# Patient Record
Sex: Female | Born: 1960 | Race: White | Hispanic: No | Marital: Married | Smoking: Never smoker
Health system: Southern US, Community
[De-identification: ages and names within clinical notes are randomized; demographics above are authoritative.]

## PROBLEM LIST (undated history)

## (undated) DIAGNOSIS — G43909 Migraine, unspecified, not intractable, without status migrainosus: Secondary | ICD-10-CM

## (undated) DIAGNOSIS — M1990 Unspecified osteoarthritis, unspecified site: Secondary | ICD-10-CM

## (undated) HISTORY — PX: ECTOPIC PREGNANCY SURGERY: SHX613

## (undated) HISTORY — PX: TONSILLECTOMY: SUR1361

## (undated) HISTORY — PX: TUBAL LIGATION: SHX77

## (undated) HISTORY — DX: Unspecified osteoarthritis, unspecified site: M19.90

## (undated) HISTORY — PX: ADENOIDECTOMY: SUR15

## (undated) HISTORY — PX: TYMPANOPLASTY: SHX33

## (undated) HISTORY — DX: Migraine, unspecified, not intractable, without status migrainosus: G43.909

---

## 1998-05-12 ENCOUNTER — Other Ambulatory Visit: Admission: RE | Admit: 1998-05-12 | Discharge: 1998-05-12 | Payer: Self-pay | Admitting: Obstetrics & Gynecology

## 2000-06-07 ENCOUNTER — Other Ambulatory Visit: Admission: RE | Admit: 2000-06-07 | Discharge: 2000-06-07 | Payer: Self-pay | Admitting: Obstetrics & Gynecology

## 2002-01-07 ENCOUNTER — Other Ambulatory Visit: Admission: RE | Admit: 2002-01-07 | Discharge: 2002-01-07 | Payer: Self-pay | Admitting: Obstetrics & Gynecology

## 2006-06-08 ENCOUNTER — Ambulatory Visit (HOSPITAL_COMMUNITY): Admission: RE | Admit: 2006-06-08 | Discharge: 2006-06-08 | Payer: Self-pay | Admitting: Obstetrics & Gynecology

## 2011-11-02 ENCOUNTER — Other Ambulatory Visit: Payer: Self-pay | Admitting: Obstetrics & Gynecology

## 2014-07-06 ENCOUNTER — Other Ambulatory Visit: Payer: Self-pay | Admitting: Obstetrics & Gynecology

## 2014-07-07 LAB — CYTOLOGY - PAP

## 2016-11-13 ENCOUNTER — Other Ambulatory Visit: Payer: Self-pay | Admitting: Obstetrics & Gynecology

## 2016-11-14 LAB — CYTOLOGY - PAP

## 2018-02-20 ENCOUNTER — Other Ambulatory Visit: Payer: Self-pay | Admitting: Obstetrics & Gynecology

## 2018-02-20 DIAGNOSIS — R928 Other abnormal and inconclusive findings on diagnostic imaging of breast: Secondary | ICD-10-CM

## 2018-02-22 ENCOUNTER — Ambulatory Visit
Admission: RE | Admit: 2018-02-22 | Discharge: 2018-02-22 | Disposition: A | Discharge status: Home / Self Care | Payer: BC Managed Care – PPO | Source: Ambulatory Visit | Attending: Obstetrics & Gynecology | Admitting: Obstetrics & Gynecology

## 2018-02-22 DIAGNOSIS — R928 Other abnormal and inconclusive findings on diagnostic imaging of breast: Secondary | ICD-10-CM

## 2018-10-28 IMAGING — MG DIGITAL DIAGNOSTIC UNILATERAL LEFT MAMMOGRAM WITH TOMO AND CAD
8 series · 8 of 24 positions shown · non-contrast
Comparison: February 19, 2018

CLINICAL DATA: Possible masses identified in the left breast recent
screening mammogram [DATE].

EXAM:
DIGITAL DIAGNOSTIC LEFT MAMMOGRAM WITH CAD AND TOMO
ULTRASOUND LEFT BREAST

[L ML synth-2D]
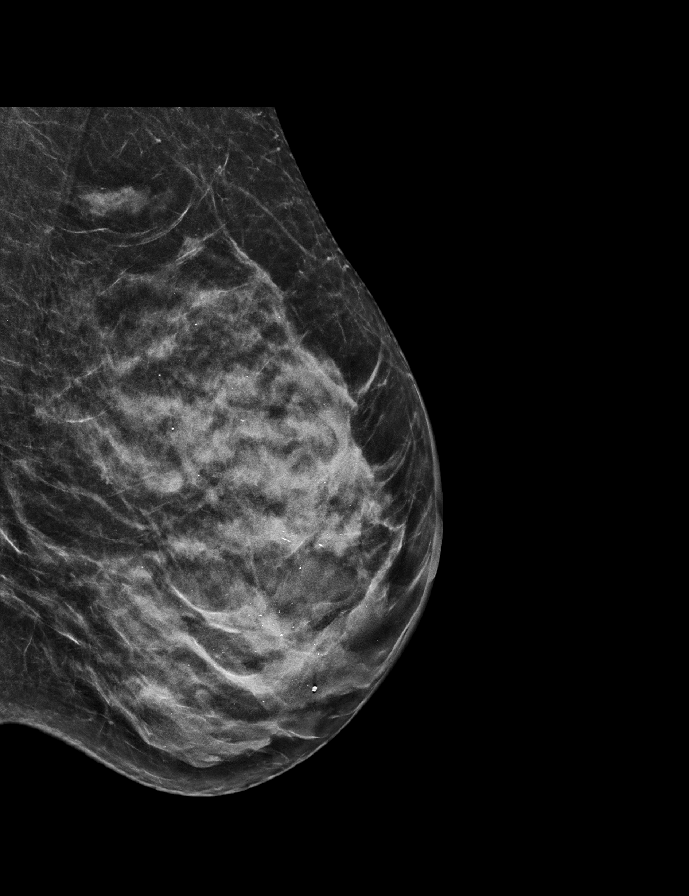

[L CC synth-2D (1 of 2)]
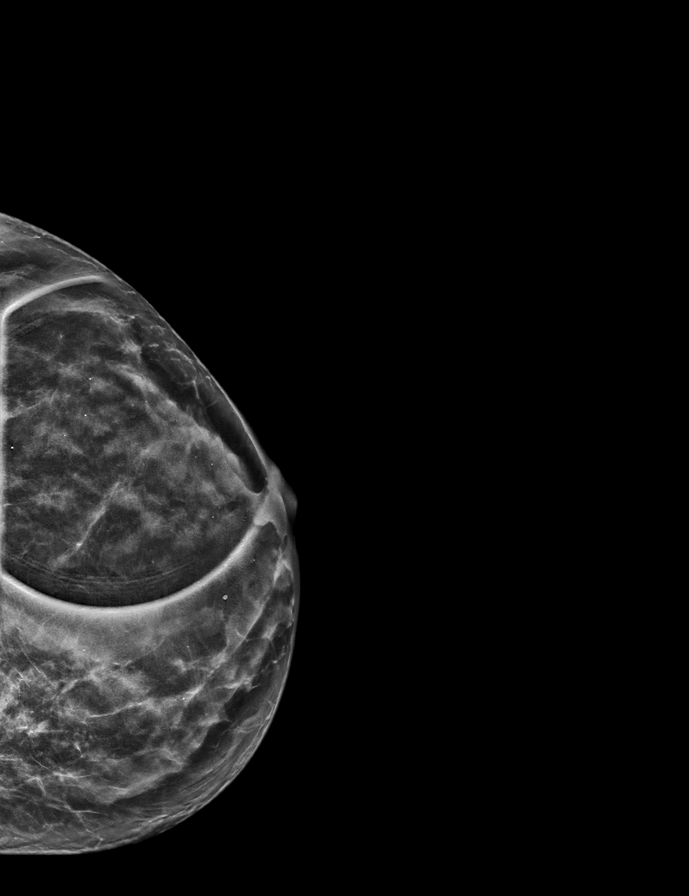

[L CC synth-2D (2 of 2)]
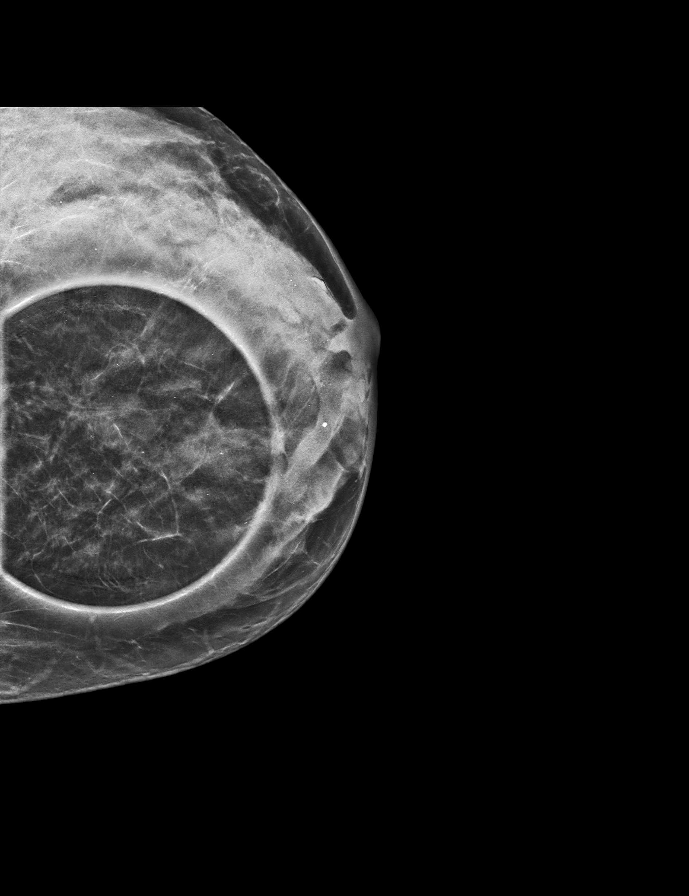

[L MLO synth-2D]
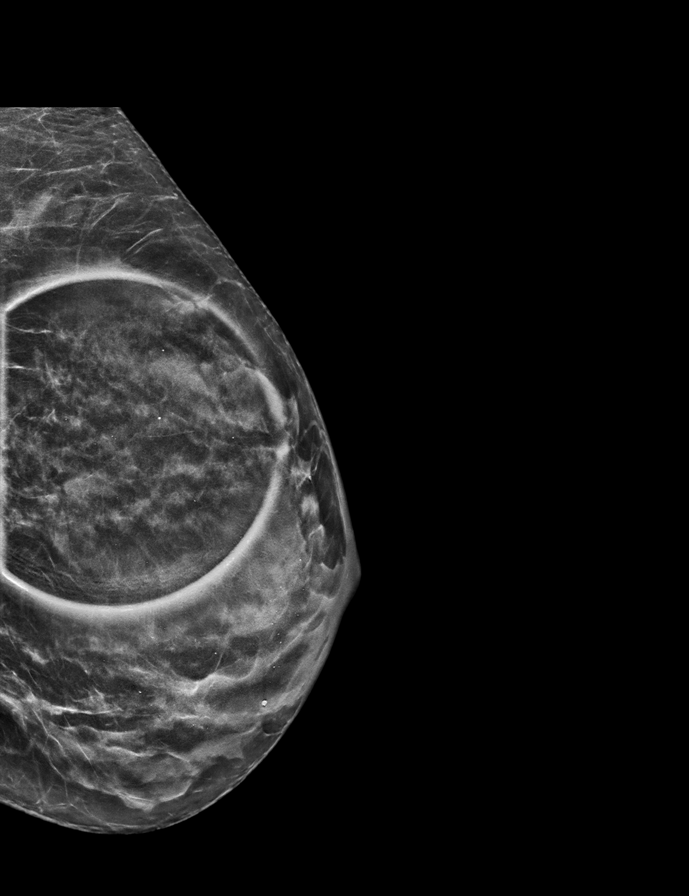

[L CC tomo (1 of 2) · tomo slice 23/44.0]
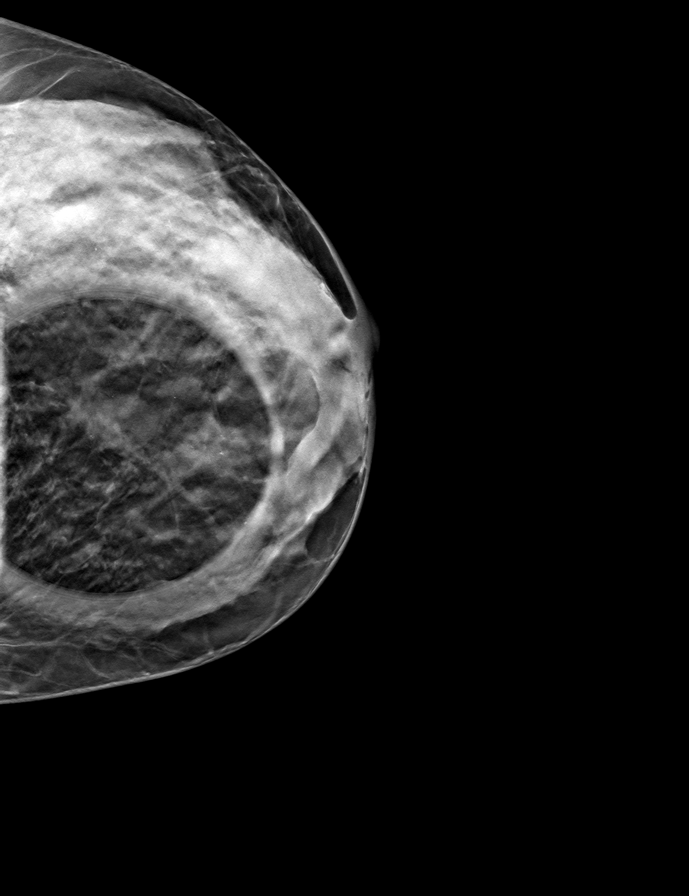

[L CC tomo (2 of 2) · tomo slice 26/51.0]
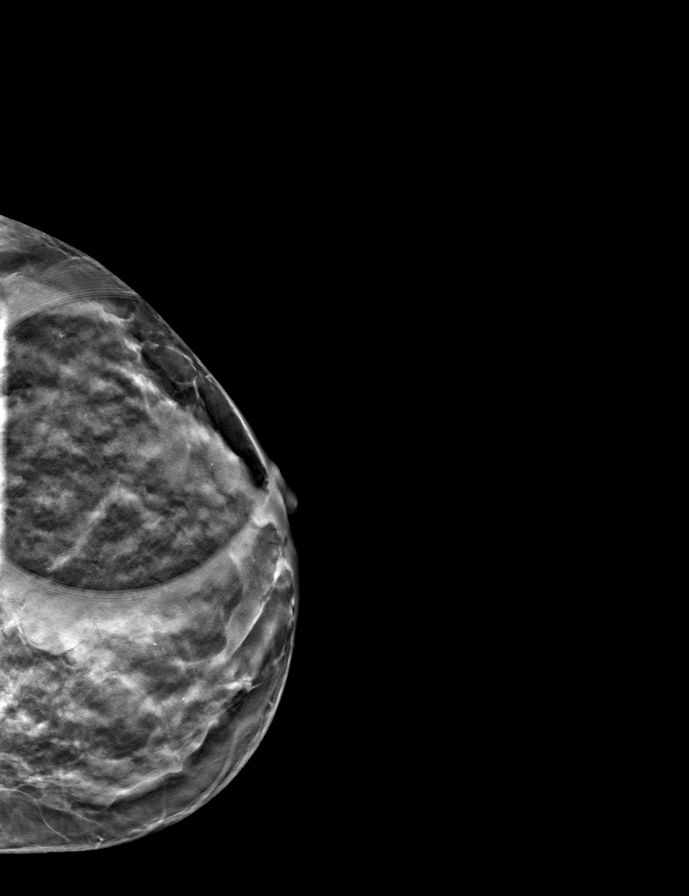

[L ML tomo · tomo slice 27/53.0]
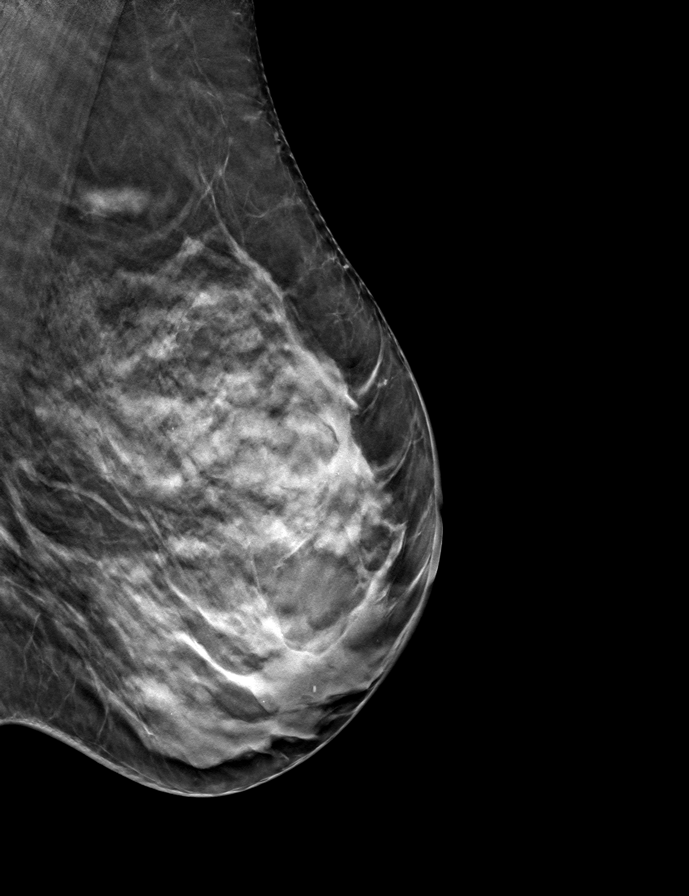

[L MLO tomo · tomo slice 27/53.0]
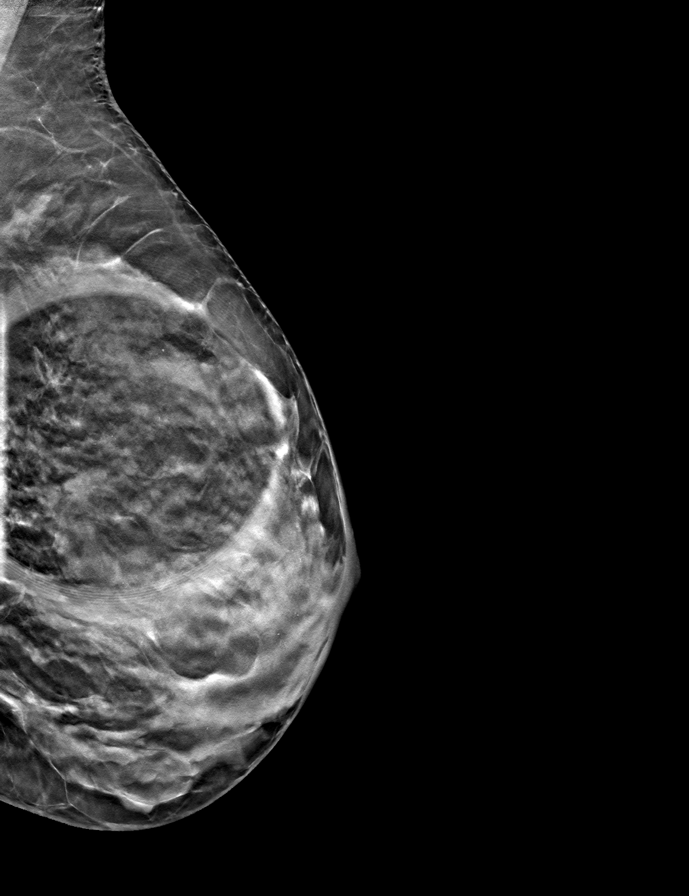

[8 of 24 positions shown; findings below may reference images not displayed]

ACR Breast Density Category c: The breast tissue is heterogeneously
dense, which may obscure small masses.
FINDINGS: Spot compression views of left breast confirm an oval circumscribed
mass the upper inner quadrant measuring approximately 1.7 cm.
Additionally, just lateral to the nipple is either a circumscribed
mass or duct ectasia.

Mammographic images were processed with CAD.

Targeted ultrasound is performed, showing a 1.7 x 0 7 x 1.7 cm
simple cyst at [DATE] position 2 cm from. This corresponds with the
location of the mammographically detected mass. In the periareolar
left breast there is mild benign duct ectasia and a 0.5 cm simple
cyst. These findings account for the area in question on recent
screening mammogram in the periareolar left breast. No suspicious
masses identified in the left breast.
IMPRESSION: Benign cysts and benign duct ectasia in the left breast. No evidence
of malignancy.

RECOMMENDATION:
Screening mammogram in one year.(Code:FG-V-ADF)

I have discussed the findings and recommendations with the patient.
Results were also provided in writing at the conclusion of the
visit. If applicable, a reminder letter will be sent to the patient
regarding the next appointment.

BI-RADS CATEGORY  2: Benign.

## 2018-10-28 IMAGING — US ULTRASOUND LEFT BREAST LIMITED
1 series · 10 of 10 positions shown · non-contrast
Comparison: February 19, 2018

CLINICAL DATA: Possible masses identified in the left breast recent
screening mammogram [DATE].

EXAM:
DIGITAL DIAGNOSTIC LEFT MAMMOGRAM WITH CAD AND TOMO
ULTRASOUND LEFT BREAST

[Series 1: ultrasound left breast limited · 0.06mm/px · 10 of 10 slices shown]
[im 1/10]
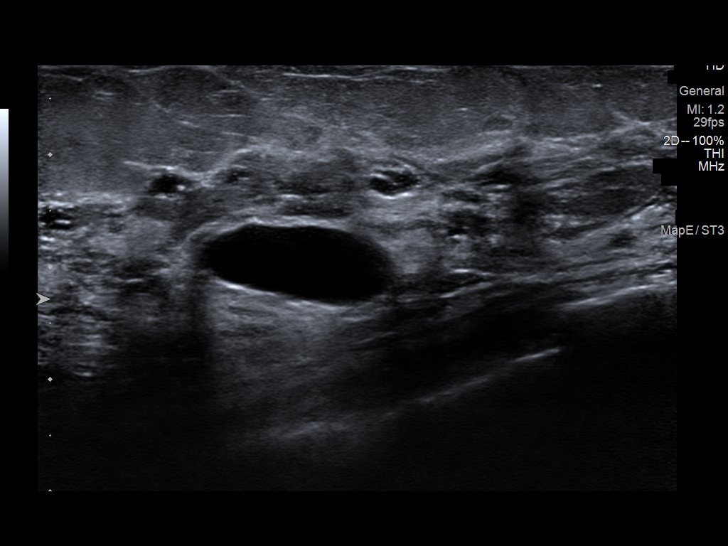
[im 2/10]
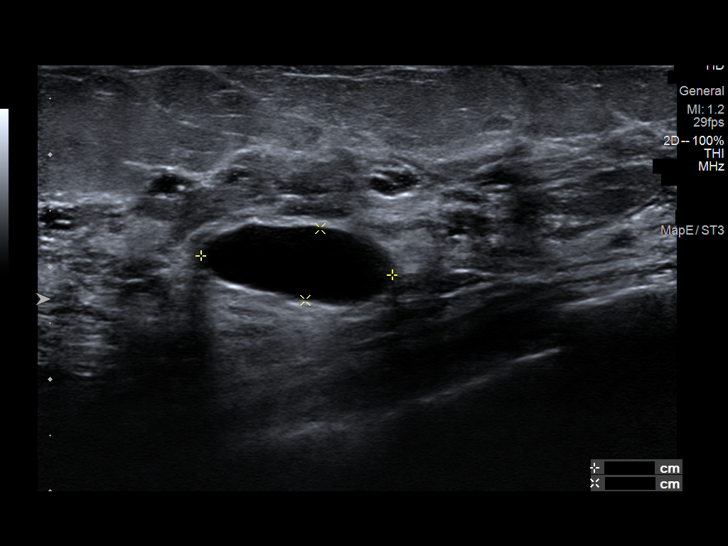
[im 3/10]
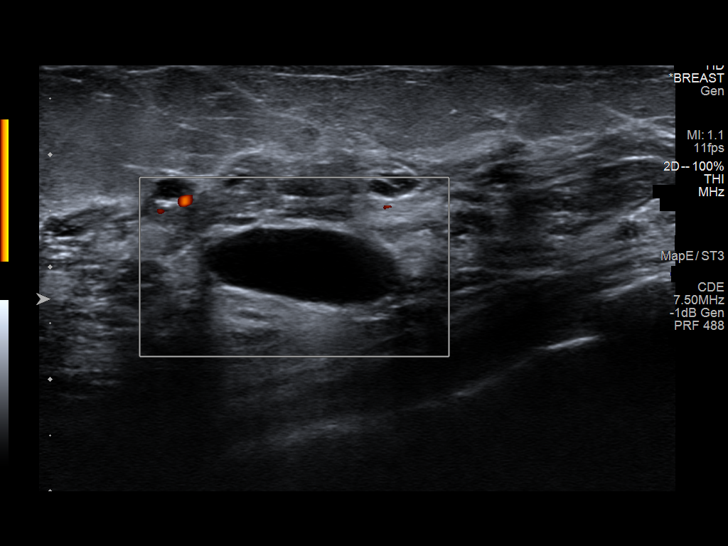
[im 4/10]
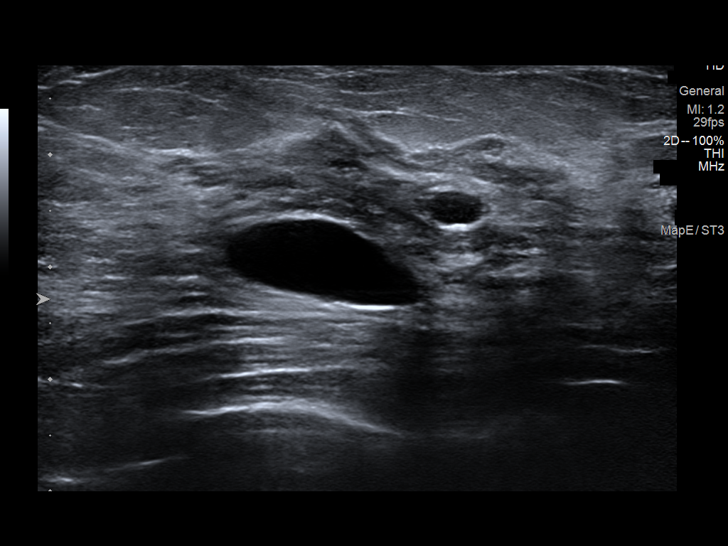
[im 5/10]
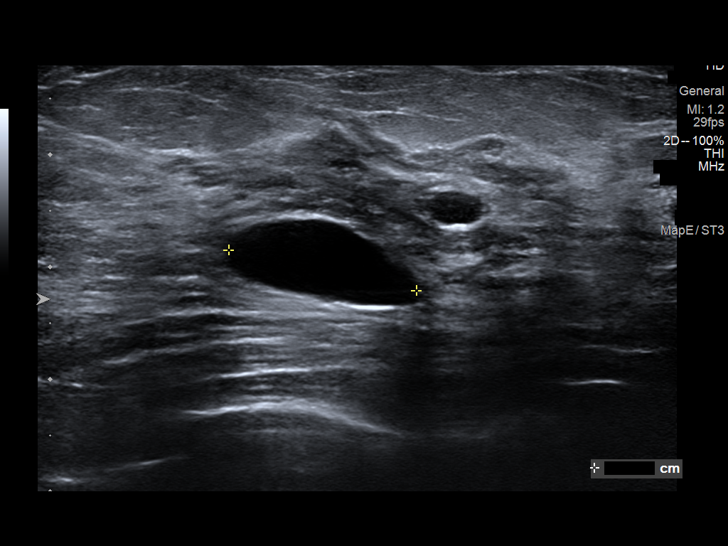
[im 6/10]
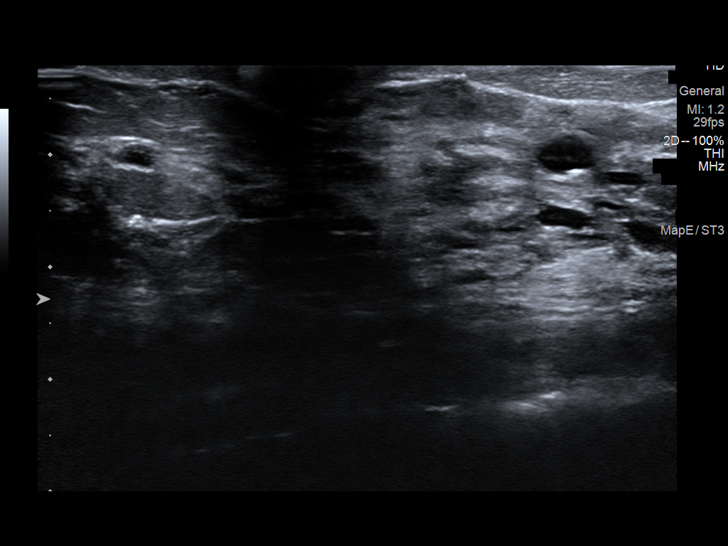
[im 7/10]
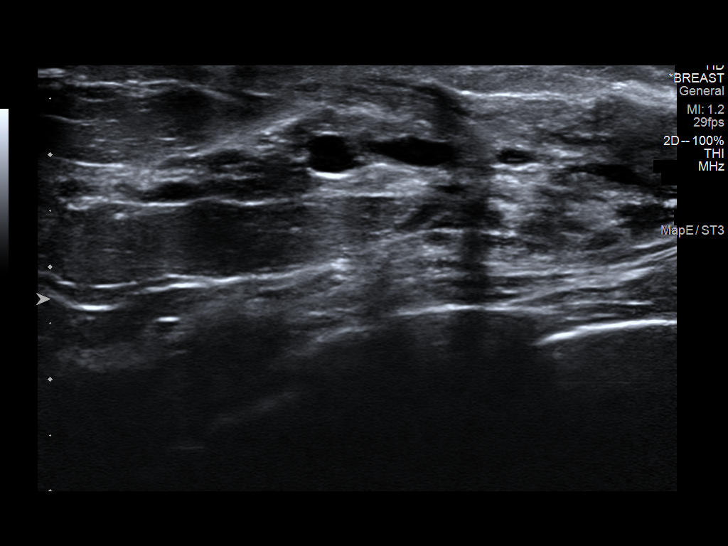
[im 8/10]
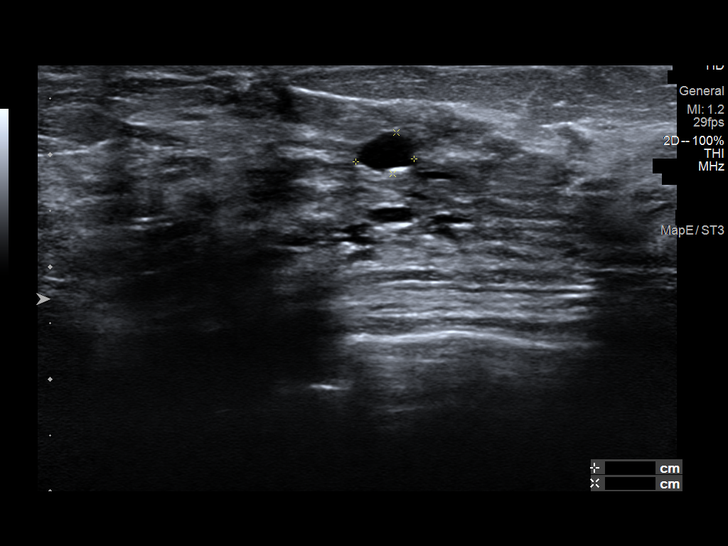
[im 9/10]
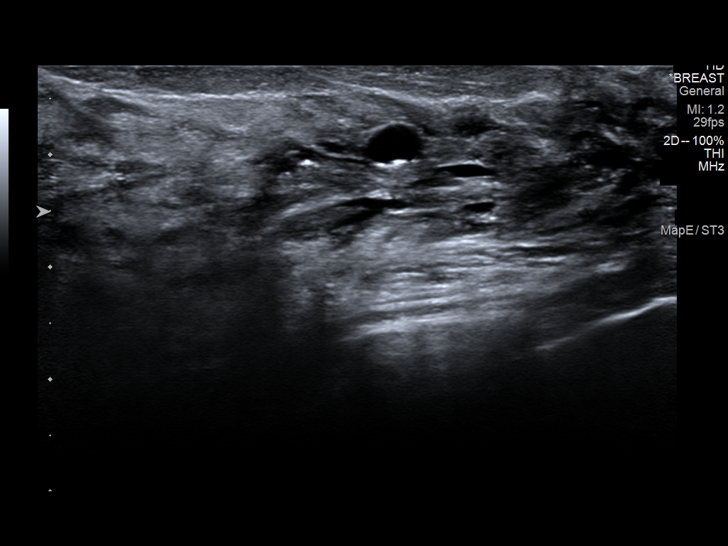
[im 10/10]
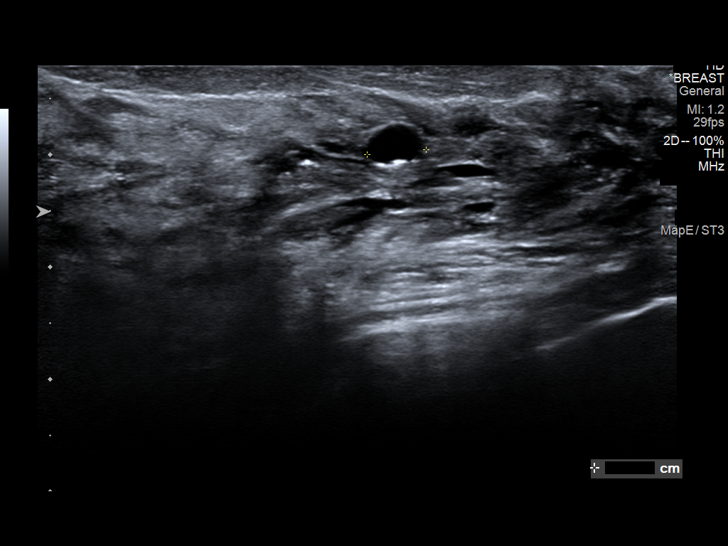

[10 of 10 positions shown; findings below may reference images not displayed]

ACR Breast Density Category c: The breast tissue is heterogeneously
dense, which may obscure small masses.
FINDINGS: Spot compression views of left breast confirm an oval circumscribed
mass the upper inner quadrant measuring approximately 1.7 cm.
Additionally, just lateral to the nipple is either a circumscribed
mass or duct ectasia.

Mammographic images were processed with CAD.

Targeted ultrasound is performed, showing a 1.7 x 0 7 x 1.7 cm
simple cyst at [DATE] position 2 cm from. This corresponds with the
location of the mammographically detected mass. In the periareolar
left breast there is mild benign duct ectasia and a 0.5 cm simple
cyst. These findings account for the area in question on recent
screening mammogram in the periareolar left breast. No suspicious
masses identified in the left breast.
IMPRESSION: Benign cysts and benign duct ectasia in the left breast. No evidence
of malignancy.

RECOMMENDATION:
Screening mammogram in one year.(Code:FG-V-ADF)

I have discussed the findings and recommendations with the patient.
Results were also provided in writing at the conclusion of the
visit. If applicable, a reminder letter will be sent to the patient
regarding the next appointment.

BI-RADS CATEGORY  2: Benign.

## 2022-11-27 ENCOUNTER — Ambulatory Visit: Payer: BC Managed Care – PPO | Admitting: Internal Medicine

## 2022-11-27 ENCOUNTER — Encounter: Payer: Self-pay | Admitting: Internal Medicine

## 2022-11-27 VITALS — BP 138/80 | HR 72 | Temp 97.4°F | Resp 16 | Ht 65.0 in | Wt 162.4 lb

## 2022-11-27 DIAGNOSIS — G43109 Migraine with aura, not intractable, without status migrainosus: Secondary | ICD-10-CM | POA: Diagnosis not present

## 2022-11-27 DIAGNOSIS — M1990 Unspecified osteoarthritis, unspecified site: Secondary | ICD-10-CM | POA: Diagnosis not present

## 2022-11-27 DIAGNOSIS — F4323 Adjustment disorder with mixed anxiety and depressed mood: Secondary | ICD-10-CM | POA: Insufficient documentation

## 2022-11-27 DIAGNOSIS — Z0000 Encounter for general adult medical examination without abnormal findings: Secondary | ICD-10-CM | POA: Insufficient documentation

## 2022-11-27 DIAGNOSIS — Z6827 Body mass index (BMI) 27.0-27.9, adult: Secondary | ICD-10-CM | POA: Diagnosis not present

## 2022-11-27 MED ORDER — ESCITALOPRAM OXALATE 10 MG PO TABS: 10.0000 mg | ORAL_TABLET | Freq: Every day | ORAL | 1 refills | Status: DC

## 2022-11-27 NOTE — Assessment & Plan Note (Signed)
She has probable OA.  Dr. Stann Mainland wrote her for meloxicam '15mg'$  po daily prn.  She can add tylenol prn to this.  I am going to check a RF.

## 2022-11-27 NOTE — Assessment & Plan Note (Signed)
I want her to eat healthy, exercise and lose weight. 

## 2022-11-27 NOTE — Assessment & Plan Note (Signed)
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  She score perfect on the MMSE today.  We did a PHQ2 which was 0 but I asked nursing to do a full PHQ9 where she scored a 12.  She states she does not feel depressed but she is having insomnia and problems with concentration.  I think she has stressors that is causing this.  I want her to try melatonin again for sleep and we will start her on a trial of lexapro '10mg'$  daily.  I will see her back in 2 months.

## 2022-11-27 NOTE — Assessment & Plan Note (Signed)
She does not have many migraines during the year but she is able to control this with OTC meds.

## 2022-11-27 NOTE — Progress Notes (Signed)
Office Visit  Subjective   Patient ID: Susan Friedman   DOB: 02-26-1961   Age: 62 y.o.   MRN: [id number]   Chief Complaint Chief Complaint  Patient presents with   Acute Visit    Memory issues     History of Present Illness Susan Friedman is a 62 year old Caucasian/White female who presents for her annual health maintenance exam and to discuss where she is having some confusion and she has lost her way with driving.  She also states she cannot concentrate as much and she is stressed out.This patient's past medical history Migraine and arthritis.   Her last eye exam was 2 years ago and she states her eyes are doing well. She had a colonoscopy done on 07/17/2014 and this was normal.  Her last digital screening mammogram was done on 07/04/2022 and it was normal.  There is no colon cancer in the family however there is CAD. She did have a stress ECHO done for chest pain in 09/2011 and this showed no evidence of exercise induced ischemia and she had normal contractility without regional wall motion abnormality.The patient is followed by Dr. Stann Mainland in OB/GYN for pap smears. There is no family history of osteoporosis. She exercises with walking 3-4 times a week. She does get yearly flu vaccines.  She had one J&J vaccine at the beginning of COVID and had another booster after that.  . She is not on an ASA.   Susan Friedman comes in today with problems with concentration and she states she is stressed out.  She wonders if she is having a problem with her memory as there is dementia that was in her mother.  She states she has been driving and forgets at times how to get to certain places.  She states she can be preparing meals and forgets what she puts in the preparation.  She states she is worried about her family with her sons and her husband's health problems and other family health problems.  We did a PHQ2 where this was a 0 however on further questioning, she is having problems with insomnia with getting and staying  asleep, fatigue, poor appetite, trouble with concentration, problems with fidgety but being quiet.  She states in order to get her thoughts out she has to go slow and concentrate on what she is saying.  She has guitly feelings. There is no feelings of feeling of worthlessness, or helpless.  There is some weight gain.  There is no no social withdrawal, no loss of pleasure in things she likes doing.  There is no SI/HI ideation.  She is not having crying episodes and no panic attacks.  She also has a history of migraines which she states started maybe in her 13's.  She states she has this once every couple of months but has not had one in the last 3 months.  Her migraine occurs all over the top of her head.  She will see wavy lines before her migraines.  If she takes ibuprofen quickly her symptoms will go away.  She can get photophonophobia with n/v but otherwise she can control her migraines OTC meds.  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  She has pain in her MCP and PIP joints with swelling and pain.  This started about 1 years ago.  She has difficulty with turning pages and opening cans.  She saw my NP who did labs and  these were allo normal.  She has not had xrays done of her hands.  She states Dr. Stann Mainland prescribed her meloxicam for her arthritis and that does help.               Past Medical History Past Medical History:  Diagnosis Date   Arthritis    Migraine      Allergies Not on File   Medications No current outpatient medications on file.   Review of Systems Review of Systems  Constitutional:  Positive for malaise/fatigue. Negative for chills, fever and weight loss.  HENT:  Negative for hearing loss and tinnitus.   Eyes:  Negative for blurred vision and double vision.  Respiratory:  Negative for cough, hemoptysis, shortness of breath and wheezing.   Cardiovascular:  Negative for chest pain, palpitations and leg  swelling.  Gastrointestinal:  Negative for abdominal pain, blood in stool, constipation, diarrhea, heartburn, melena, nausea and vomiting.  Genitourinary:  Negative for frequency and hematuria.  Musculoskeletal:  Negative for myalgias.  Skin:  Negative for itching and rash.  Neurological:  Negative for dizziness, weakness and headaches.  Psychiatric/Behavioral:  Negative for depression. The patient has insomnia.        Objective:    Vitals BP 138/80   Pulse 72   Temp (!) 97.4 F (36.3 C)   Resp 16   Ht '5\' 5"'$  (1.651 m)   Wt 162 lb 6.4 oz (73.7 kg)   SpO2 98%   BMI 27.02 kg/m    Physical Examination Physical Exam Constitutional:      Appearance: Normal appearance. She is not ill-appearing.  HENT:     Head: Normocephalic and atraumatic.     Right Ear: Tympanic membrane, ear canal and external ear normal.     Left Ear: Tympanic membrane, ear canal and external ear normal.     Nose: Nose normal. No congestion or rhinorrhea.     Mouth/Throat:     Mouth: Mucous membranes are moist.     Pharynx: Oropharynx is clear. No oropharyngeal exudate or posterior oropharyngeal erythema.  Eyes:     General: No scleral icterus.    Conjunctiva/sclera: Conjunctivae normal.     Pupils: Pupils are equal, round, and reactive to light.  Neck:     Vascular: No carotid bruit.  Cardiovascular:     Rate and Rhythm: Normal rate and regular rhythm.     Pulses: Normal pulses.     Heart sounds: No murmur heard.    No friction rub. No gallop.  Pulmonary:     Effort: Pulmonary effort is normal. No respiratory distress.     Breath sounds: No wheezing, rhonchi or rales.  Abdominal:     General: Abdomen is flat. Bowel sounds are normal. There is no distension.     Tenderness: There is no abdominal tenderness.  Musculoskeletal:     Cervical back: Neck supple. No tenderness.     Right lower leg: No edema.     Left lower leg: No edema.  Lymphadenopathy:     Cervical: No cervical adenopathy.  Skin:     General: Skin is warm and dry.     Findings: No rash.  Neurological:     General: No focal deficit present.     Mental Status: She is alert and oriented to person, place, and time.  Psychiatric:        Mood and Affect: Mood normal.        Behavior: Behavior normal.  Assessment & Plan:   Migraine with aura and without status migrainosus, not intractable She does not have many migraines during the year but she is able to control this with OTC meds.  Arthritis She has probable OA.  Dr. Stann Mainland wrote her for meloxicam '15mg'$  po daily prn.  She can add tylenol prn to this.  I am going to check a RF.  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  She score perfect on the MMSE today.  We did a PHQ2 which was 0 but I asked nursing to do a full PHQ9 where she scored a 12.  She states she does not feel depressed but she is having insomnia and problems with concentration.  I think she has stressors that is causing this.  I want her to try melatonin again for sleep and we will start her on a trial of lexapro '10mg'$  daily.  I will see her back in 2 months.  Annual physical exam Health maintenance discussed.  I reviewed Dr. Gwynne Edinger note from 06/2022.  We will do some labs today.  BMI 27.0-27.9,adult I want her to eat healthy, exercise and lose weight.    Return in about 2 months (around 01/27/2023).   Townsend Roger, MD

## 2022-11-27 NOTE — Assessment & Plan Note (Signed)
Health maintenance discussed.  I reviewed Dr. Gwynne Edinger note from 06/2022.  We will do some labs today.

## 2022-11-28 LAB — CMP14 + ANION GAP
ALT: 15 IU/L (ref 0–32)
AST: 23 IU/L (ref 0–40)
Albumin/Globulin Ratio: 1.8 (ref 1.2–2.2)
Albumin: 4.1 g/dL (ref 3.8–4.9)
Alkaline Phosphatase: 65 IU/L (ref 44–121)
Anion Gap: 11 mmol/L (ref 10.0–18.0)
BUN/Creatinine Ratio: 23 (ref 12–28)
BUN: 18 mg/dL (ref 8–27)
Bilirubin Total: 0.3 mg/dL (ref 0.0–1.2)
CO2: 24 mmol/L (ref 20–29)
Calcium: 9.3 mg/dL (ref 8.7–10.3)
Chloride: 104 mmol/L (ref 96–106)
Creatinine, Ser: 0.8 mg/dL (ref 0.57–1.00)
Globulin, Total: 2.3 g/dL (ref 1.5–4.5)
Glucose: 96 mg/dL (ref 70–99)
Potassium: 4.4 mmol/L (ref 3.5–5.2)
Sodium: 139 mmol/L (ref 134–144)
Total Protein: 6.4 g/dL (ref 6.0–8.5)
eGFR: 84 mL/min/{1.73_m2} (ref >59)

## 2022-11-28 LAB — CBC WITH DIFFERENTIAL/PLATELET
Basophils Absolute: 0 10*3/uL (ref 0.0–0.2)
Basos: 0 %
EOS (ABSOLUTE): 0.1 10*3/uL (ref 0.0–0.4)
Eos: 1 %
Hematocrit: 39.1 % (ref 34.0–46.6)
Hemoglobin: 13.2 g/dL (ref 11.1–15.9)
Immature Grans (Abs): 0 10*3/uL (ref 0.0–0.1)
Immature Granulocytes: 0 %
Lymphocytes Absolute: 1.8 10*3/uL (ref 0.7–3.1)
Lymphs: 25 %
MCH: 32.8 pg (ref 26.6–33.0)
MCHC: 33.8 g/dL (ref 31.5–35.7)
MCV: 97 fL (ref 79–97)
Monocytes Absolute: 0.4 10*3/uL (ref 0.1–0.9)
Monocytes: 6 %
Neutrophils Absolute: 4.8 10*3/uL (ref 1.4–7.0)
Neutrophils: 68 %
Platelets: 238 10*3/uL (ref 150–450)
RBC: 4.02 x10E6/uL (ref 3.77–5.28)
RDW: 11.9 % (ref 11.7–15.4)
WBC: 7 10*3/uL (ref 3.4–10.8)

## 2022-11-28 LAB — VITAMIN D 25 HYDROXY (VIT D DEFICIENCY, FRACTURES): Vit D, 25-Hydroxy: 87 ng/mL (ref 30.0–100.0)

## 2022-11-28 LAB — TSH: TSH: 1.02 u[IU]/mL (ref 0.450–4.500)

## 2022-11-28 LAB — RHEUMATOID FACTOR: Rhuematoid fact SerPl-aCnc: 11.8 IU/mL (ref <14.0)

## 2023-01-10 ENCOUNTER — Other Ambulatory Visit: Payer: Self-pay | Admitting: Internal Medicine

## 2023-01-10 MED ORDER — DOXYCYCLINE MONOHYDRATE 100 MG PO CAPS: 100.0000 mg | ORAL_CAPSULE | Freq: Two times a day (BID) | ORAL | 0 refills | Status: DC

## 2023-01-12 ENCOUNTER — Other Ambulatory Visit: Payer: Self-pay | Admitting: Internal Medicine

## 2023-01-29 ENCOUNTER — Other Ambulatory Visit: Payer: Self-pay

## 2023-01-29 MED ORDER — ESCITALOPRAM OXALATE 10 MG PO TABS: 10.0000 mg | ORAL_TABLET | Freq: Every day | ORAL | 1 refills | Status: DC

## 2023-02-02 ENCOUNTER — Ambulatory Visit: Payer: BC Managed Care – PPO | Admitting: Internal Medicine

## 2023-03-16 ENCOUNTER — Other Ambulatory Visit: Payer: Self-pay | Admitting: Internal Medicine

## 2023-04-22 ENCOUNTER — Other Ambulatory Visit: Payer: Self-pay | Admitting: Internal Medicine

## 2023-05-18 ENCOUNTER — Other Ambulatory Visit: Payer: Self-pay | Admitting: Internal Medicine

## 2023-06-12 ENCOUNTER — Ambulatory Visit: Payer: BC Managed Care – PPO | Admitting: Student

## 2023-06-12 ENCOUNTER — Encounter: Payer: Self-pay | Admitting: Student

## 2023-06-12 VITALS — BP 118/74 | HR 66 | Temp 97.7°F | Resp 18 | Ht 65.0 in | Wt 157.2 lb

## 2023-06-12 DIAGNOSIS — S99921D Unspecified injury of right foot, subsequent encounter: Secondary | ICD-10-CM | POA: Diagnosis not present

## 2023-06-12 DIAGNOSIS — S99921A Unspecified injury of right foot, initial encounter: Secondary | ICD-10-CM

## 2023-06-12 DIAGNOSIS — Z5189 Encounter for other specified aftercare: Secondary | ICD-10-CM | POA: Insufficient documentation

## 2023-06-12 NOTE — Assessment & Plan Note (Signed)
Her right foot appears to be healing very well. She should continue supportive care and can wear post op shoe to decrease irritation to scar.

## 2023-06-22 DIAGNOSIS — S99921A Unspecified injury of right foot, initial encounter: Secondary | ICD-10-CM | POA: Insufficient documentation

## 2023-06-22 NOTE — Assessment & Plan Note (Signed)
Her right foot appears to be healing very well. The sutures dissolved, there is no signs of infection. She can wear supportive shoes or sandals to relieve discomfort.

## 2023-07-12 ENCOUNTER — Other Ambulatory Visit: Payer: Self-pay | Admitting: Obstetrics & Gynecology

## 2023-07-12 DIAGNOSIS — R928 Other abnormal and inconclusive findings on diagnostic imaging of breast: Secondary | ICD-10-CM

## 2023-07-18 ENCOUNTER — Other Ambulatory Visit: Payer: Self-pay | Admitting: Student

## 2023-07-18 DIAGNOSIS — S99921A Unspecified injury of right foot, initial encounter: Secondary | ICD-10-CM

## 2023-07-23 ENCOUNTER — Other Ambulatory Visit: Payer: Self-pay | Admitting: Obstetrics & Gynecology

## 2023-07-23 ENCOUNTER — Ambulatory Visit
Admission: RE | Admit: 2023-07-23 | Discharge: 2023-07-23 | Disposition: A | Discharge status: Home / Self Care | Payer: BC Managed Care – PPO | Source: Ambulatory Visit | Attending: Obstetrics & Gynecology | Admitting: Obstetrics & Gynecology

## 2023-07-23 DIAGNOSIS — R921 Mammographic calcification found on diagnostic imaging of breast: Secondary | ICD-10-CM

## 2023-07-23 DIAGNOSIS — R928 Other abnormal and inconclusive findings on diagnostic imaging of breast: Secondary | ICD-10-CM

## 2023-07-25 ENCOUNTER — Ambulatory Visit
Admission: RE | Admit: 2023-07-25 | Discharge: 2023-07-25 | Disposition: A | Discharge status: Home / Self Care | Payer: BC Managed Care – PPO | Source: Ambulatory Visit | Attending: Obstetrics & Gynecology | Admitting: Obstetrics & Gynecology

## 2023-07-25 DIAGNOSIS — R928 Other abnormal and inconclusive findings on diagnostic imaging of breast: Secondary | ICD-10-CM

## 2023-07-25 DIAGNOSIS — R921 Mammographic calcification found on diagnostic imaging of breast: Secondary | ICD-10-CM

## 2023-07-25 HISTORY — PX: BREAST BIOPSY: SHX20

## 2023-07-26 LAB — SURGICAL PATHOLOGY

## 2023-07-31 ENCOUNTER — Other Ambulatory Visit: Payer: Self-pay | Admitting: Internal Medicine

## 2023-10-12 ENCOUNTER — Other Ambulatory Visit: Payer: Self-pay | Admitting: Internal Medicine

## 2023-12-06 ENCOUNTER — Other Ambulatory Visit: Payer: Self-pay | Admitting: Internal Medicine

## 2023-12-06 MED ORDER — AMOXICILLIN-POT CLAVULANATE 875-125 MG PO TABS: 1.0000 | ORAL_TABLET | Freq: Two times a day (BID) | ORAL | 0 refills | Status: DC

## 2023-12-13 ENCOUNTER — Other Ambulatory Visit: Payer: Self-pay

## 2023-12-13 DIAGNOSIS — F4323 Adjustment disorder with mixed anxiety and depressed mood: Secondary | ICD-10-CM

## 2023-12-13 MED ORDER — ESCITALOPRAM OXALATE 10 MG PO TABS: 10.0000 mg | ORAL_TABLET | Freq: Every day | ORAL | 1 refills | Status: DC

## 2023-12-13 NOTE — Progress Notes (Signed)
 Rx refill

## 2024-01-16 ENCOUNTER — Encounter: Payer: Self-pay | Admitting: Internal Medicine

## 2024-01-24 ENCOUNTER — Other Ambulatory Visit: Payer: Self-pay | Admitting: Internal Medicine

## 2024-01-24 DIAGNOSIS — F4323 Adjustment disorder with mixed anxiety and depressed mood: Secondary | ICD-10-CM

## 2024-01-24 MED ORDER — ESCITALOPRAM OXALATE 10 MG PO TABS: 10.0000 mg | ORAL_TABLET | Freq: Every day | ORAL | 1 refills | Status: DC

## 2024-03-11 ENCOUNTER — Other Ambulatory Visit: Payer: Self-pay | Admitting: Internal Medicine

## 2024-03-12 ENCOUNTER — Telehealth: Payer: Self-pay | Admitting: Internal Medicine

## 2024-03-12 VITALS — Ht 65.0 in | Wt 158.0 lb

## 2024-03-12 DIAGNOSIS — Z6826 Body mass index (BMI) 26.0-26.9, adult: Secondary | ICD-10-CM | POA: Diagnosis not present

## 2024-03-12 DIAGNOSIS — S92901D Unspecified fracture of right foot, subsequent encounter for fracture with routine healing: Secondary | ICD-10-CM | POA: Diagnosis not present

## 2024-03-12 DIAGNOSIS — E663 Overweight: Secondary | ICD-10-CM | POA: Diagnosis not present

## 2024-03-12 DIAGNOSIS — S92901A Unspecified fracture of right foot, initial encounter for closed fracture: Secondary | ICD-10-CM | POA: Insufficient documentation

## 2024-03-12 MED ORDER — PHENTERMINE HCL 37.5 MG PO TABS: 37.5000 mg | ORAL_TABLET | Freq: Every day | ORAL | 1 refills | Status: DC

## 2024-03-12 NOTE — Assessment & Plan Note (Signed)
 She is now out of her walking boot and has seen ortho in followup.  She can start advancing her exercise.

## 2024-03-12 NOTE — Assessment & Plan Note (Signed)
 Plan as above.

## 2024-03-12 NOTE — Progress Notes (Signed)
   Office Visit  Subjective   Patient ID: Susan Friedman   DOB: 1961/03/16   Age: 63 y.o.   MRN: [id number]   Chief Complaint No chief complaint on file.    History of Present Illness Susan Friedman is a 63 yo female who calls in today for a telehealth visit.  She is interested in weight loss management.  The patient has been on adipex in the past but has gained weight recently due to breaking her foot in 12/2023.  She was excercising and walking daily before that but was not able to do anything exercise wise as she had a walking boot.  This was removed a week or so ago.  She currently weighs 158 lbs  and she is 5'5''.  She is eating healthy.  She eats fast food but healthy choices.  She does not drink sweet tea of soft drinks.  She has been adipex in the past without any side effects..       Past Medical History Past Medical History:  Diagnosis Date   Arthritis    Migraine      Allergies Allergies  Allergen Reactions   Penicillins Hives and Shortness Of Breath   Sulfa Antibiotics Rash     Medications  Current Outpatient Medications:    escitalopram  (LEXAPRO ) 10 MG tablet, Take 1 tablet (10 mg total) by mouth daily., Disp: 90 tablet, Rfl: 1   estradiol (ESTRACE) 0.5 MG tablet, Take 1 mg by mouth daily., Disp: , Rfl:    medroxyPROGESTERone (PROVERA) 5 MG tablet, take 1 tablet (5 mg) by oral route once daily, Disp: 90 tablet, Rfl: 3   Review of Systems Review of Systems  Eyes:  Negative for blurred vision.  Respiratory:  Negative for shortness of breath.   Cardiovascular:  Negative for chest pain and palpitations.  Gastrointestinal:  Negative for abdominal pain, constipation, diarrhea, nausea and vomiting.  Neurological:  Negative for dizziness, weakness and headaches.       Objective:    Vitals Ht 5' 5 (1.651 m)   Wt 158 lb (71.7 kg)   BMI 26.29 kg/m    Physical Examination Physical Exam Constitutional:      Appearance: Normal appearance.   Neurological:     Mental  Status: She is alert.   Psychiatric:        Mood and Affect: Mood normal.        Behavior: Behavior normal.        Assessment & Plan:   Closed fracture of bone of right foot She is now out of her walking boot and has seen ortho in followup.  She can start advancing her exercise.  BMI 26.0-26.9,adult The patient is overweight.  We will start her on some adipex 37.5mg  po daily for 2 months and reassess.  I want her to increase her exercise and continue with healthy eating.  Our goal will be to lose 2-4 lbs per month while on adipex.  Overweight Plan as above.    No follow-ups on file.   Selinda Fleeta Finger, MD

## 2024-03-12 NOTE — Assessment & Plan Note (Signed)
 The patient is overweight.  We will start her on some adipex 37.5mg  po daily for 2 months and reassess.  I want her to increase her exercise and continue with healthy eating.  Our goal will be to lose 2-4 lbs per month while on adipex.

## 2024-04-14 ENCOUNTER — Other Ambulatory Visit: Payer: Self-pay | Admitting: Internal Medicine

## 2024-04-30 ENCOUNTER — Other Ambulatory Visit: Payer: Self-pay | Admitting: Internal Medicine

## 2024-04-30 MED ORDER — PHENTERMINE HCL 37.5 MG PO TABS: 37.5000 mg | ORAL_TABLET | Freq: Every day | ORAL | 1 refills | Status: DC

## 2024-06-24 ENCOUNTER — Other Ambulatory Visit: Payer: Self-pay | Admitting: Internal Medicine

## 2024-07-17 ENCOUNTER — Other Ambulatory Visit: Payer: Self-pay | Admitting: Internal Medicine

## 2024-07-17 MED ORDER — PHENTERMINE HCL 37.5 MG PO TABS: 37.5000 mg | ORAL_TABLET | Freq: Every day | ORAL | 1 refills | Status: DC

## 2024-07-24 ENCOUNTER — Other Ambulatory Visit: Payer: Self-pay | Admitting: Internal Medicine

## 2024-07-24 DIAGNOSIS — F4323 Adjustment disorder with mixed anxiety and depressed mood: Secondary | ICD-10-CM

## 2024-07-27 ENCOUNTER — Other Ambulatory Visit: Payer: Self-pay | Admitting: Internal Medicine

## 2024-07-27 MED ORDER — DOXYCYCLINE MONOHYDRATE 100 MG PO CAPS: 100.0000 mg | ORAL_CAPSULE | Freq: Two times a day (BID) | ORAL | 0 refills | Status: DC

## 2024-08-20 ENCOUNTER — Other Ambulatory Visit: Payer: Self-pay | Admitting: Internal Medicine

## 2024-08-20 DIAGNOSIS — F4323 Adjustment disorder with mixed anxiety and depressed mood: Secondary | ICD-10-CM

## 2024-08-20 MED ORDER — ESCITALOPRAM OXALATE 10 MG PO TABS: 10.0000 mg | ORAL_TABLET | Freq: Every day | ORAL | 3 refills | Status: AC

## 2024-08-20 MED ORDER — PHENTERMINE HCL 37.5 MG PO TABS: 37.5000 mg | ORAL_TABLET | Freq: Every day | ORAL | 2 refills | Status: AC
